# Patient Record
Sex: Female | Born: 1965 | Race: White | Hispanic: No | Marital: Married | Smoking: Never smoker
Health system: Southern US, Community
[De-identification: ages and names within clinical notes are randomized; demographics above are authoritative.]

## PROBLEM LIST (undated history)

## (undated) DIAGNOSIS — G43909 Migraine, unspecified, not intractable, without status migrainosus: Secondary | ICD-10-CM

## (undated) HISTORY — PX: ABLATION ON ENDOMETRIOSIS: SHX5787

## (undated) HISTORY — PX: NASAL SINUS SURGERY: SHX719

---

## 2017-08-08 ENCOUNTER — Emergency Department (HOSPITAL_BASED_OUTPATIENT_CLINIC_OR_DEPARTMENT_OTHER)
Admission: EM | Admit: 2017-08-08 | Discharge: 2017-08-08 | Disposition: A | Discharge status: Home / Self Care | Payer: Worker's Compensation | Attending: Emergency Medicine | Admitting: Emergency Medicine

## 2017-08-08 ENCOUNTER — Encounter (HOSPITAL_BASED_OUTPATIENT_CLINIC_OR_DEPARTMENT_OTHER): Payer: Self-pay

## 2017-08-08 ENCOUNTER — Emergency Department (HOSPITAL_BASED_OUTPATIENT_CLINIC_OR_DEPARTMENT_OTHER): Payer: Worker's Compensation

## 2017-08-08 DIAGNOSIS — Z79899 Other long term (current) drug therapy: Secondary | ICD-10-CM | POA: Diagnosis not present

## 2017-08-08 DIAGNOSIS — M79601 Pain in right arm: Secondary | ICD-10-CM | POA: Diagnosis not present

## 2017-08-08 HISTORY — DX: Migraine, unspecified, not intractable, without status migrainosus: G43.909

## 2017-08-08 MED ORDER — NAPROXEN 250 MG PO TABS
500.0000 mg | ORAL_TABLET | Freq: Once | ORAL | Status: AC
  Administered 2017-08-08: 500 mg via ORAL
  Filled 2017-08-08: qty 2

## 2017-08-08 MED ORDER — NAPROXEN 375 MG PO TABS: 375.0000 mg | ORAL_TABLET | Freq: Two times a day (BID) | ORAL | 0 refills | Status: AC

## 2017-08-08 MED ORDER — CYCLOBENZAPRINE HCL 10 MG PO TABS: 10.0000 mg | ORAL_TABLET | Freq: Two times a day (BID) | ORAL | 0 refills | Status: AC | PRN
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
## 2017-08-08 NOTE — ED Notes (Signed)
ED Provider at bedside. 
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
## 2017-08-08 NOTE — ED Notes (Signed)
Ice bag already done a triage

## 2017-08-08 NOTE — Discharge Instructions (Signed)
Your x-ray showed no signs of fracture.  This is likely a sprain.  Wear the sling for comfort. Please rest, ice, compress and elevated the affected body part to help with swelling and pain.  Please take the Naproxen as prescribed for pain. Do not take any additional NSAIDs including Motrin, Aleve, Ibuprofen, Advil.  Please the the Flexeril for muscle relaxation. This medication will make you drowsy so avoid situation that could place you in danger.   Please follow-up with orthopedic doctor symptoms not improving.  Return to ED if your symptoms worsen.

## 2018-07-03 ENCOUNTER — Emergency Department (HOSPITAL_BASED_OUTPATIENT_CLINIC_OR_DEPARTMENT_OTHER)
Admission: EM | Admit: 2018-07-03 | Discharge: 2018-07-03 | Disposition: A | Discharge status: Home / Self Care | Payer: No Typology Code available for payment source | Attending: Emergency Medicine | Admitting: Emergency Medicine

## 2018-07-03 ENCOUNTER — Emergency Department (HOSPITAL_BASED_OUTPATIENT_CLINIC_OR_DEPARTMENT_OTHER): Payer: No Typology Code available for payment source

## 2018-07-03 ENCOUNTER — Other Ambulatory Visit: Payer: Self-pay

## 2018-07-03 ENCOUNTER — Encounter (HOSPITAL_BASED_OUTPATIENT_CLINIC_OR_DEPARTMENT_OTHER): Payer: Self-pay | Admitting: *Deleted

## 2018-07-03 DIAGNOSIS — Y92219 Unspecified school as the place of occurrence of the external cause: Secondary | ICD-10-CM | POA: Diagnosis not present

## 2018-07-03 DIAGNOSIS — W08XXXA Fall from other furniture, initial encounter: Secondary | ICD-10-CM | POA: Insufficient documentation

## 2018-07-03 DIAGNOSIS — R519 Headache, unspecified: Secondary | ICD-10-CM

## 2018-07-03 DIAGNOSIS — S99911A Unspecified injury of right ankle, initial encounter: Secondary | ICD-10-CM | POA: Diagnosis present

## 2018-07-03 DIAGNOSIS — Y990 Civilian activity done for income or pay: Secondary | ICD-10-CM | POA: Insufficient documentation

## 2018-07-03 DIAGNOSIS — S93401A Sprain of unspecified ligament of right ankle, initial encounter: Secondary | ICD-10-CM | POA: Insufficient documentation

## 2018-07-03 DIAGNOSIS — Y9389 Activity, other specified: Secondary | ICD-10-CM | POA: Diagnosis not present

## 2018-07-03 DIAGNOSIS — Z79899 Other long term (current) drug therapy: Secondary | ICD-10-CM | POA: Insufficient documentation

## 2018-07-03 DIAGNOSIS — S63501A Unspecified sprain of right wrist, initial encounter: Secondary | ICD-10-CM | POA: Diagnosis not present

## 2018-07-03 DIAGNOSIS — R51 Headache: Secondary | ICD-10-CM | POA: Insufficient documentation

## 2018-07-03 MED ORDER — METHOCARBAMOL 500 MG PO TABS: 500.0000 mg | ORAL_TABLET | Freq: Two times a day (BID) | ORAL | 0 refills | Status: AC

## 2018-07-03 NOTE — ED Notes (Signed)
ED Provider at bedside. 

## 2018-07-03 NOTE — Discharge Instructions (Signed)
You can take Tylenol or Ibuprofen as directed for pain. You can alternate Tylenol and Ibuprofen every 4 hours. If you take Tylenol at 1pm, then you can take Ibuprofen at 5pm. Then you can take Tylenol again at 9pm.   Take Robaxin as prescribed. This medication will make you drowsy so do not drive or drink alcohol when taking it.  Follow the RICE (Rest, Ice, Compression, Elevation) protocol as directed.   Follow-up with referred [facility] employee health for further occupational Worker's Comp. information.  Return to emergency department for any worsening pain, numbness/weakness of the arm or leg, discoloration of her leg, vision changes, vomiting or any other worsening or concerning symptoms.

## 2018-07-03 NOTE — ED Provider Notes (Signed)
[facility] EMERGENCY DEPARTMENT Provider Note   CSN: [id number] Arrival date & time: 07/03/18  1008     History   Chief Complaint Chief Complaint  Patient presents with  . Fall    HPI Ariel Warner is a 52 y.o. female who presents for evaluation of right lower extremity, right upper extremity and headache that began after mechanical fall at approximate 9 AM this morning.  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  Patient is unsure of how she landed.  She does say witnesses reported that she hit her head.  Patient does not remember hitting her head.  She denies any LOC.  She states that she hit the right lower extremity and right upper extremity.  She reports pain to the distal right tib-fib, right ankle and right foot.  Additionally has some pain to the right wrist.  Ports she has been able to ambulate but reports difficulty bearing her full weight on right lower extremity.  Patient reports that upon ED arrival, she has a diffuse headache.  She does report she has a history of migraines and states that this headache feels similar to her migraines.  She reports associated nausea, photophobia.  Denies any vomiting, vision changes, numbness/weakness of her bilateral upper and lower extremities.  Patient reports she is currently not on any blood thinners.  She denies any preceding chest pain, dizziness prior to onset of fall.  Patient denies any chest pain, difficulty breathing, abdominal pain.  The history is provided by the patient.    Past Medical History:  Diagnosis Date  . Migraine     There are no active problems to display for this patient.   Past Surgical History:  Procedure Laterality Date  . ABLATION ON ENDOMETRIOSIS    . NASAL SINUS SURGERY       OB History   None      Home Medications    Prior to Admission medications   Medication Sig Start Date End Date Taking?  Authorizing Provider  cyclobenzaprine (FLEXERIL) 10 MG tablet Take 1 tablet (10 mg total) 2 (two) times daily as needed by mouth for muscle spasms. 08/08/17   Rise Mu, PA-C  Escitalopram Oxalate (LEXAPRO PO) Take by mouth.    [provider]  methocarbamol (ROBAXIN) 500 MG tablet Take 1 tablet (500 mg total) by mouth 2 (two) times daily. 07/03/18   Maxwell Caul, PA-C  naproxen (NAPROSYN) 375 MG tablet Take 1 tablet (375 mg total) 2 (two) times daily by mouth. 08/08/17   Demetrios Loll T, PA-C  Ondansetron HCl (ZOFRAN PO) Take by mouth.    [provider]  Promethazine HCl (PHENERGAN PO) Take by mouth.    [provider]  SUMAtriptan Succinate (IMITREX PO) Take by mouth.    [provider]  Topiramate (TOPAMAX PO) Take by mouth.    [provider]    Family History History reviewed. No pertinent family history.  Social History Social History   Tobacco Use  . Smoking status: Never Smoker  . Smokeless tobacco: Never Used  Substance Use Topics  . Alcohol use: No    Frequency: Never  . Drug use: No     Allergies   Sulfa antibiotics   Review of Systems Review of Systems  Eyes: Positive for photophobia.  Respiratory: Negative for cough and shortness of breath.   Cardiovascular: Negative  for chest pain.  Gastrointestinal: Negative for abdominal pain, nausea and vomiting.  Musculoskeletal:       Right wrist pain Right foot, ankle pain  Neurological: Positive for headaches. Negative for weakness and numbness.     Physical Exam Updated Vital Signs BP 116/71 (BP Location: Right Arm)   Pulse 77   Temp 98.1 F (36.7 C) (Oral)   Resp 16   Ht 5\' 2"  (1.575 m)   Wt 68.5 kg   SpO2 100%   BMI 27.62 kg/m   Physical Exam  Constitutional: She is oriented to person, place, and time. She appears well-developed and well-nourished.  HENT:  Head: Normocephalic and atraumatic.  Mouth/Throat: Oropharynx is clear and moist  and mucous membranes are normal.  No tenderness to palpation of skull. No deformities or crepitus noted. No open wounds, abrasions or lacerations.   Eyes: Pupils are equal, round, and reactive to light. Conjunctivae, EOM and lids are normal.  PERRL, EOM  Neck: Full passive range of motion without pain.  Full flexion/extension and lateral movement of neck fully intact. No bony midline tenderness. No deformities or crepitus.   Cardiovascular: Normal rate, regular rhythm and normal heart sounds. Exam reveals no gallop and no friction rub.  No murmur heard. Pulses:      Radial pulses are 2+ on the right side, and 2+ on the left side.       Dorsalis pedis pulses are 2+ on the right side, and 2+ on the left side.  Pulmonary/Chest: Effort normal and breath sounds normal.  Abdominal: Soft. Normal appearance. There is no tenderness. There is no rigidity and no guarding.  Musculoskeletal: Normal range of motion.  Tenderness palpation noted to the radial aspect of the right wrist.  There is some overlying soft tissue swelling and ecchymosis.  No deformity or crepitus noted.  Flexion/extension intact without any difficulty.  No tenderness palpation to right elbow, right shoulder.  Keep patient can move all 5 digits of right hand without any difficulty.  No tenderness palpation noted to left upper extremity.  Tenderness palpation noted to the distal tib-fib, right ankle and dorsal aspect of right foot.  There is some overlying soft tissue swelling and ecchymosis.  No deformity or crepitus noted.  Plantarflexion and dorsiflexion of right foot intact with any difficulty.  No tenderness palpation right knee.  Flexion/extension and internal and external rotation of right lower extremity intact.  No tenderness palpation left hip, left knee.  There is some ecchymosis over the dorsal aspect left foot.  No tenderness palpation.  No deformity or crepitus noted.  Full flexion/extension and internal and external rotation of  left lower extremity intact without any difficulty.  Neurological: She is alert and oriented to person, place, and time.  Follows commands, Moves all extremities  5/5 strength to BUE and BLE  Sensation intact throughout all major nerve distributions CN II-XII intact No slurred speech No facial asymmetry   Skin: Skin is warm and dry. Capillary refill takes less than 2 seconds.  Psychiatric: She has a normal mood and affect. Her speech is normal.  Nursing note and vitals reviewed.    ED Treatments / Results  Labs (all labs ordered are listed, but only abnormal results are displayed) Labs Reviewed - No data to display  EKG None  Radiology Dg Wrist Complete Right  Result Date: 07/03/2018 CLINICAL DATA:  Pain following fall EXAM: RIGHT WRIST - COMPLETE 3+ VIEW COMPARISON:  Right hand radiographs August 08, 2017 FINDINGS: Frontal, oblique,  lateral, and ulnar deviation scaphoid images were obtained. There is no fracture or dislocation. Joint spaces appear normal. No erosive change. IMPRESSION: No fracture or dislocation.  No evident arthropathy. Electronically Signed   By: Bretta Bang III M.D.   On: 07/03/2018 12:29   Dg Tibia/fibula Right  Result Date: 07/03/2018 CLINICAL DATA:  Pain following fall EXAM: RIGHT TIBIA AND FIBULA - 2 VIEW COMPARISON:  None. FINDINGS: Frontal and lateral views were obtained. No fracture or dislocation. No abnormal periosteal reaction. Joint spaces appear normal. IMPRESSION: No fracture or dislocation.  No evident arthropathy. Electronically Signed   By: Bretta Bang III M.D.   On: 07/03/2018 12:28   Dg Ankle Complete Right  Result Date: 07/03/2018 CLINICAL DATA:  Pain following fall EXAM: RIGHT ANKLE - COMPLETE 3+ VIEW COMPARISON:  None. FINDINGS: Frontal, oblique, and lateral views obtained. No evident fracture or joint effusion. Joint spaces appear normal. No erosive change. Ankle mortise appears intact. IMPRESSION: No fracture or appreciable  arthropathy. Ankle mortise appears intact. Electronically Signed   By: Bretta Bang III M.D.   On: 07/03/2018 12:27   Ct Head Wo Contrast  Result Date: 07/03/2018 CLINICAL DATA:  Pain following fall EXAM: CT HEAD WITHOUT CONTRAST TECHNIQUE: Contiguous axial images were obtained from the base of the skull through the vertex without intravenous contrast. COMPARISON:  Brain MRI September 02, 2016 FINDINGS: Brain: The ventricles are normal in size and configuration. There is cerebellar tonsillar ectopia, unchanged. There is no intracranial mass, hemorrhage, extra-axial fluid collection, or midline shift. Brain parenchyma appears unremarkable. No evident acute infarct. Vascular: No hyperdense vessel. There is no appreciable vascular calcification. Skull: The bony calvarium appears intact. Sinuses/Orbits: There is mucosal thickening in several ethmoid air cells. Other visualized paranasal sinuses are clear. Patient is status post antrostomies bilaterally. Orbits appear symmetric bilaterally. Other: Mastoids on the right are clear. There is opacification of multiple mastoid air cells on the left. IMPRESSION: 1. 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 Appearance stable compared to prior MR. 2. Brain parenchyma appears normal. No acute infarct. No mass or hemorrhage. 3.  Multifocal mastoid air cell disease on the left. 4. Mucosal thickening in several ethmoid air cells. Status post antrostomies bilaterally. Electronically Signed   By: Bretta Bang III M.D.   On: 07/03/2018 12:01   Dg Foot Complete Right  Result Date: 07/03/2018 CLINICAL DATA:  Pain following fall EXAM: RIGHT FOOT COMPLETE - 3+ VIEW COMPARISON:  None. FINDINGS: Frontal, oblique, and lateral views were obtained. There is no fracture or dislocation. Joint spaces appear normal. No erosive change. IMPRESSION: No fracture or dislocation.  No evident arthropathy. Electronically Signed   By: Bretta Bang  III M.D.   On: 07/03/2018 12:27    Procedures Procedures (including critical care time)  Medications Ordered in ED Medications - No data to display   Initial Impression / Assessment and Plan / ED Course  I have reviewed the triage vital signs and the nursing notes.  Pertinent labs & imaging results that were available during my care of the patient were reviewed by me and considered in my medical decision making (see chart for details).     52 year old female who presents for evaluation after a mechanical fall off a table that occurred at 9 AM.  Reports right lower extremity, right wrist pain.  Also endorses a headache.  History of migraines and states that headache feels similar.  Questionable head injury.  No LOC.  Patient is afebrile,  non-toxic appearing, sitting comfortably on examination table. Vital signs reviewed and stable. No neuro deficits noted on exam. Patient is neurovascularly intact.  Low suspicion for fracture dislocation but a consideration.  Also consider sprain versus musculoskeletal injury.  Will plan for x-ray imaging of both the right lower extremity and the right wrist.  I discussed with patient regarding her head injury.  Patient reports no LOC, is not on blood thinners no vomiting.  Additionally, patient reports she is having a headache and photophobia.  She does state that this feels like her normal migraine but given headache after head injury, discussed with him regarding further work-up here in the ED.  We discussed risk first benefits of obtaining further imaging for evaluation and engaged in shared decision making with the patient. Will plan for CT head for further evaluation. Will hold on migraine treatment until head CT.  Wrist x-ray shows no evidence of fracture or dislocation.  Tib-fib, ankle, foot x-ray negative for any acute abnormality.  CT head shows no evidence of acute abnormality.  She does have mention of mild cerebellar tonsillar ectopia consistent with  Chiari I malformation noted in previous MRI.   Discussed results with patient.  She reports she still having her migraine headache.  I offered to give migraine cocktail here in the ED or her oral medications here in the ED.  We also discussed that waiting till she gets home to take her normal migraine medications.  Patient opted to wait till she gets home to take her normal migraine medications.  Patient provided wrist splint and ASO splint for wrist and ankle sprain.  Encouraged at home supportive care measures.  Will give patient outpatient information for [facility] follow-up for occupational health. Patient had ample opportunity for questions and discussion. All patient's questions were answered with full understanding. Strict return precautions discussed. Patient expresses understanding and agreement to plan.    Final Clinical Impressions(s) / ED Diagnoses   Final diagnoses:  Sprain of right ankle, unspecified ligament, initial encounter  Sprain of right wrist, initial encounter  Acute nonintractable headache, unspecified headache type    ED Discharge Orders         Ordered    methocarbamol (ROBAXIN) 500 MG tablet  2 times daily     07/03/18 1303           Rosana Hoes 07/03/18 1956    Alvira Monday, MD 07/04/18 0725

## 2018-07-03 NOTE — ED Triage Notes (Signed)
Pt reports slip and fall from table onto tile floor, unsure if she hit her head, no loc, right anterior shin pain and swelling and headache.
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
